# Patient Record
Sex: Female | Born: 1975 | Race: Black or African American | Hispanic: No | Marital: Single | Smoking: Current every day smoker
Health system: Southern US, Community
[De-identification: ages and names within clinical notes are randomized; demographics above are authoritative.]

## PROBLEM LIST (undated history)

## (undated) DIAGNOSIS — Z789 Other specified health status: Secondary | ICD-10-CM

## (undated) HISTORY — PX: NO PAST SURGERIES: SHX2092

---

## 2014-05-27 ENCOUNTER — Emergency Department (HOSPITAL_BASED_OUTPATIENT_CLINIC_OR_DEPARTMENT_OTHER)
Admission: EM | Admit: 2014-05-27 | Discharge: 2014-05-28 | Disposition: A | Discharge status: Home / Self Care | Payer: Medicaid Other | Attending: Emergency Medicine | Admitting: Emergency Medicine

## 2014-05-27 ENCOUNTER — Encounter (HOSPITAL_BASED_OUTPATIENT_CLINIC_OR_DEPARTMENT_OTHER): Payer: Self-pay | Admitting: *Deleted

## 2014-05-27 ENCOUNTER — Emergency Department (HOSPITAL_BASED_OUTPATIENT_CLINIC_OR_DEPARTMENT_OTHER): Payer: Medicaid Other

## 2014-05-27 DIAGNOSIS — Z3A12 12 weeks gestation of pregnancy: Secondary | ICD-10-CM | POA: Diagnosis not present

## 2014-05-27 DIAGNOSIS — Z79899 Other long term (current) drug therapy: Secondary | ICD-10-CM | POA: Diagnosis not present

## 2014-05-27 DIAGNOSIS — O2331 Infections of other parts of urinary tract in pregnancy, first trimester: Secondary | ICD-10-CM | POA: Diagnosis not present

## 2014-05-27 DIAGNOSIS — R1030 Lower abdominal pain, unspecified: Secondary | ICD-10-CM | POA: Diagnosis not present

## 2014-05-27 DIAGNOSIS — A5901 Trichomonal vulvovaginitis: Secondary | ICD-10-CM | POA: Insufficient documentation

## 2014-05-27 DIAGNOSIS — N390 Urinary tract infection, site not specified: Secondary | ICD-10-CM

## 2014-05-27 DIAGNOSIS — Z87891 Personal history of nicotine dependence: Secondary | ICD-10-CM | POA: Insufficient documentation

## 2014-05-27 DIAGNOSIS — O98311 Other infections with a predominantly sexual mode of transmission complicating pregnancy, first trimester: Secondary | ICD-10-CM | POA: Diagnosis not present

## 2014-05-27 DIAGNOSIS — O9989 Other specified diseases and conditions complicating pregnancy, childbirth and the puerperium: Secondary | ICD-10-CM | POA: Insufficient documentation

## 2014-05-27 DIAGNOSIS — Z3401 Encounter for supervision of normal first pregnancy, first trimester: Secondary | ICD-10-CM

## 2014-05-27 LAB — URINALYSIS, ROUTINE W REFLEX MICROSCOPIC
BILIRUBIN URINE: NEGATIVE
Glucose, UA: NEGATIVE mg/dL
Hgb urine dipstick: NEGATIVE
Ketones, ur: NEGATIVE mg/dL
Nitrite: NEGATIVE
PROTEIN: NEGATIVE mg/dL
Specific Gravity, Urine: 1.014 (ref 1.005–1.030)
Urobilinogen, UA: 0.2 mg/dL (ref 0.0–1.0)
pH: 5.5 (ref 5.0–8.0)

## 2014-05-27 LAB — URINE MICROSCOPIC-ADD ON

## 2014-05-27 LAB — WET PREP, GENITAL: YEAST WET PREP: NONE SEEN

## 2014-05-27 MED ORDER — CEPHALEXIN 250 MG PO CAPS
500.0000 mg | ORAL_CAPSULE | Freq: Once | ORAL | Status: AC
  Administered 2014-05-28: 500 mg via ORAL
  Filled 2014-05-27: qty 2

## 2014-05-27 MED ORDER — METRONIDAZOLE 500 MG PO TABS
500.0000 mg | ORAL_TABLET | Freq: Once | ORAL | Status: AC
  Administered 2014-05-28: 500 mg via ORAL
  Filled 2014-05-27: qty 1

## 2014-05-27 MED ORDER — ACETAMINOPHEN 500 MG PO TABS
1000.0000 mg | ORAL_TABLET | Freq: Once | ORAL | Status: AC
  Administered 2014-05-27: 1000 mg via ORAL
  Filled 2014-05-27: qty 2

## 2014-05-27 MED ORDER — LIDOCAINE HCL (PF) 1 % IJ SOLN
INTRAMUSCULAR | Status: AC
Start: 2014-05-27 — End: 2014-05-28
  Administered 2014-05-28: 1.5 mL
  Filled 2014-05-27: qty 5

## 2014-05-27 MED ORDER — METRONIDAZOLE 500 MG PO TABS: 500.0000 mg | ORAL_TABLET | Freq: Two times a day (BID) | ORAL | Status: DC

## 2014-05-27 MED ORDER — CEFTRIAXONE SODIUM 1 G IJ SOLR
1.0000 g | Freq: Once | INTRAMUSCULAR | Status: AC
  Administered 2014-05-28: 1 g via INTRAMUSCULAR
  Filled 2014-05-27: qty 10

## 2014-05-27 NOTE — ED Notes (Signed)
To pt room to assess pt not in room at this time.

## 2014-05-27 NOTE — ED Provider Notes (Signed)
CSN: [id number]     Arrival date & time 05/27/14  1821 History   First MD Initiated Contact with Patient 05/27/14 2125     Chief Complaint  Patient presents with  . Abdominal Pain     (Consider location/radiation/quality/duration/timing/severity/associated sxs/prior Treatment) HPI  Michelle Meza is a 39 y.o. female believe she is [redacted] weeks pregnant, last menstrual period on November 5, [facility], she is G3 P2 complaining of lower abdominal pain worsening over the course of 2 days, rated as severe, no pain medication taken prior to arrival. Patient denies dysuria, hematuria, abnormal vaginal discharge, vaginal bleeding, fever, chills, nausea, vomiting. She states that her pain is midline and she endorses a urinary frequency on review of systems. Patient has not seen OB/GYN in this pregnancy (note that this contradicts triage note) she's been taking prenatal vitamins.  History reviewed. No pertinent past medical history. History reviewed. No pertinent past surgical history. No family history on file. History  Substance Use Topics  . Smoking status: Former Games developermoker  . Smokeless tobacco: Not on file  . Alcohol Use: No   OB History    Gravida Para Term Preterm AB TAB SAB Ectopic Multiple Living   1              Review of Systems  10 systems reviewed and found to be negative, except as noted in the HPI.   Allergies  Review of patient's allergies indicates no known allergies.  Home Medications   Prior to Admission medications   Medication Sig Start Date End Date Taking? Authorizing Provider  Prenatal Vit-Fe Fumarate-FA (PRENATAL MULTIVITAMIN) TABS tablet Take 1 tablet by mouth daily at 12 noon.   Yes Historical Provider, MD  metroNIDAZOLE (FLAGYL) 500 MG tablet Take 1 tablet (500 mg total) by mouth 2 (two) times daily. One tab PO bid x 14 days 05/27/14   Joni ReiningNicole Durand Wittmeyer, PA-C   BP 99/47 mmHg  Pulse 61  Temp(Src) 98.1 F  (36.7 C) (Oral)  Resp 16  Ht 5' (1.524 m)  Wt 146 lb (66.225 kg)  BMI 28.51 kg/m2  SpO2 100%  LMP 03/22/2014 Physical Exam  Constitutional: She is oriented to person, place, and time. She appears well-developed and well-nourished. No distress.  HENT:  Head: Normocephalic and atraumatic.  Mouth/Throat: Oropharynx is clear and moist.  Eyes: Conjunctivae and EOM are normal. Pupils are equal, round, and reactive to light.  Neck: Normal range of motion.  Cardiovascular: Normal rate, regular rhythm and intact distal pulses.   Pulmonary/Chest: Effort normal and breath sounds normal. No stridor. No respiratory distress. She has no wheezes. She has no rales. She exhibits no tenderness.  Abdominal: Soft. Bowel sounds are normal. She exhibits no distension and no mass. There is no tenderness. There is no rebound and no guarding.  Genitourinary:  No CVA tenderness to palpation bilaterally  Pelvic exam is chaperoned by nurse: No rashes or lesions, there is a thin, opaque, non-foul-smelling white discharge from the cervix. No cervical motion or adnexal tenderness.  Musculoskeletal: Normal range of motion. She exhibits no edema or tenderness.  Neurological: She is alert and oriented to person, place, and time.  Psychiatric: She has a normal mood and affect.  Nursing note and vitals reviewed.   ED Course  Procedures (including critical care time) Labs Review Labs Reviewed  WET PREP, GENITAL - Abnormal; Notable for the following:    Trich, Wet Prep RARE (*)  Clue Cells Wet Prep HPF POC FEW (*)    WBC, Wet Prep HPF POC OCCASIONAL (*)    All other components within normal limits  URINALYSIS, ROUTINE W REFLEX MICROSCOPIC - Abnormal; Notable for the following:    APPearance CLOUDY (*)    Leukocytes, UA LARGE (*)    All other components within normal limits  URINE MICROSCOPIC-ADD ON - Abnormal; Notable for the following:    Squamous Epithelial / LPF FEW (*)    Bacteria, UA MANY (*)    All  other components within normal limits  URINE CULTURE  GC/CHLAMYDIA PROBE AMP    Imaging Review US Ob Comp Less 14 Wks  05/27/2014   CLINICAL DATA:  Acute onset of midline pelvic pain for 1 day. Chills. Initial encounter.  EXAM: OBSTETRIC <14 WK [place] AND TRANSVAGINAL OB US  TECHNIQUE: Both transabdominal and transvaginal ultrasound examinations were performed for complete evaluation of the gestation as well as the maternal uterus, adnexal regions, and pelvic cul-de-sac. Transvaginal technique was performed to assess early pregnancy.  COMPARISON:  Pelvic ultrasound performed 03/27/2003  FINDINGS: Intrauterine gestational sac: Visualized/normal in shape.  Yolk sac:  Yes  Embryo:  Yes  Cardiac Activity: Yes  Heart Rate: 160 bpm  CRL:   3.1 cm   10 w 0 d                  [place] EDC: 12/23/2014  Maternal uterus/adnexae: No subchorionic hemorrhage is noted. The uterus is otherwise unremarkable in appearance  The ovaries are within normal limits. The right ovary measures 3.3 x 1.4 x 2.6 cm, while the left ovary measures 3.9 x 2.4 x 2.7 cm with a mildly complex left-sided follicle. No suspicious adnexal masses are seen; there is no evidence for ovarian torsion.  A small amount of free fluid is noted in the pelvic cul-de-sac and left adnexa.  IMPRESSION: Single live intrauterine pregnancy noted, with a crown-rump length of 3.1 cm, corresponding to a gestational age of [redacted] weeks 0 days. This matches the gestational age of [redacted] weeks 3 days by LMP, reflecting an estimated date of delivery of December 27, 2014.   Electronically Signed   By: Roanna Raider M.D.   On: 05/27/2014 22:15   US Ob Transvaginal  05/27/2014   CLINICAL DATA:  Acute onset of midline pelvic pain for 1 day. Chills. Initial encounter.  EXAM: OBSTETRIC <14 WK [place] AND TRANSVAGINAL OB US  TECHNIQUE: Both transabdominal and transvaginal ultrasound examinations were performed for complete evaluation of the gestation as well as the maternal uterus, adnexal regions,  and pelvic cul-de-sac. Transvaginal technique was performed to assess early pregnancy.  COMPARISON:  Pelvic ultrasound performed 03/27/2003  FINDINGS: Intrauterine gestational sac: Visualized/normal in shape.  Yolk sac:  Yes  Embryo:  Yes  Cardiac Activity: Yes  Heart Rate: 160 bpm  CRL:   3.1 cm   10 w 0 d                  [place] EDC: 12/23/2014  Maternal uterus/adnexae: No subchorionic hemorrhage is noted. The uterus is otherwise unremarkable in appearance  The ovaries are within normal limits. The right ovary measures 3.3 x 1.4 x 2.6 cm, while the left ovary measures 3.9 x 2.4 x 2.7 cm with a mildly complex left-sided follicle. No suspicious adnexal masses are seen; there is no evidence for ovarian torsion.  A small amount of free fluid is noted in the pelvic cul-de-sac and left adnexa.  IMPRESSION: Single  live intrauterine pregnancy noted, with a crown-rump length of 3.1 cm, corresponding to a gestational age of [redacted] weeks 0 days. This matches the gestational age of [redacted] weeks 3 days by LMP, reflecting an estimated date of delivery of December 27, 2014.   Electronically Signed   By: Roanna Raider M.D.   On: 05/27/2014 22:15     EKG Interpretation None      MDM   Final diagnoses:  Lower abdominal pain  Pregnancy, first, first trimester  UTI (lower urinary tract infection)  Trichomonal vaginitis    Filed Vitals:   05/27/14 1858 05/27/14 2250  BP: 115/64 99/47  Pulse: 58 61  Temp: 99 F (37.2 C) 98.1 F (36.7 C)  TempSrc: Oral Oral  Resp: 18 16  Height: 5' (1.524 m)   Weight: 146 lb (66.225 kg)   SpO2: 100% 100%    Medications  metroNIDAZOLE (FLAGYL) tablet 500 mg (not administered)  cephALEXin (KEFLEX) capsule 500 mg (not administered)  cefTRIAXone (ROCEPHIN) injection 1 g (not administered)  acetaminophen (TYLENOL) tablet 1,000 mg (1,000 mg Oral Given 05/27/14 2239)    Michelle Meza is a pleasant 39 y.o. female in her first trimester presenting with lower midline abdominal pain and  urinary frequency. Ultrasound shows IUP at 10 weeks. Both ovaries are visualized and normal. No evidence for torsion. Urinalysis with many bacteria and 21-50 white blood cells, large leukocytes. Will treat her for urinary tract infection with Rocephin IM and Keflex by mouth and order urine culture.  Pelvic exam with no significant abnormalities, wet prep showing rare trek in a few clue cells. Will start her on Flagyl.  Patient states she has applied for emergency Medicaid and states that she was told that she should have her card this week.  Discussed case with attending MD who agrees with plan and stability to d/c to home.    Evaluation does not show pathology that would require ongoing emergent intervention or inpatient treatment. Pt is hemodynamically stable and mentating appropriately. Discussed findings and plan with patient/guardian, who agrees with care plan. All questions answered. Return precautions discussed and outpatient follow up given.   New Prescriptions   METRONIDAZOLE (FLAGYL) 500 MG TABLET    Take 1 tablet (500 mg total) by mouth 2 (two) times daily. One tab PO bid x 14 days         Wynetta Emery, PA-C 05/27/14 2310  Vida Roller, MD 05/27/14 [phone]

## 2014-05-27 NOTE — Discharge Instructions (Signed)
Please follow with your primary care doctor in the next 2 days for a check-up. They must obtain records for further management.   Do not hesitate to return to the Emergency Department for any new, worsening or concerning symptoms.    Abdominal Pain During Pregnancy Abdominal pain is common in pregnancy. Most of the time, it does not cause harm. There are many causes of abdominal pain. Some causes are more serious than others. Some of the causes of abdominal pain in pregnancy are easily diagnosed. Occasionally, the diagnosis takes time to understand. Other times, the cause is not determined. Abdominal pain can be a sign that something is very wrong with the pregnancy, or the pain may have nothing to do with the pregnancy at all. For this reason, always tell your health care provider if you have any abdominal discomfort. HOME CARE INSTRUCTIONS  Monitor your abdominal pain for any changes. The following actions may help to alleviate any discomfort you are experiencing:  Do not have sexual intercourse or put anything in your vagina until your symptoms go away completely.  Get plenty of rest until your pain improves.  Drink clear fluids if you feel nauseous. Avoid solid food as long as you are uncomfortable or nauseous.  Only take over-the-counter or prescription medicine as directed by your health care provider.  Keep all follow-up appointments with your health care provider. SEEK IMMEDIATE MEDICAL CARE IF:  You are bleeding, leaking fluid, or passing tissue from the vagina.  You have increasing pain or cramping.  You have persistent vomiting.  You have painful or bloody urination.  You have a fever.  You notice a decrease in your baby's movements.  You have extreme weakness or feel faint.  You have shortness of breath, with or without abdominal pain.  You develop a severe headache with abdominal pain.  You have abnormal vaginal discharge with abdominal pain.  You have persistent  diarrhea.  You have abdominal pain that continues even after rest, or gets worse. MAKE SURE YOU:   Understand these instructions.  Will watch your condition.  Will get help right away if you are not doing well or get worse. Document Released: 05/04/2005 Document Revised: 02/22/2013 Document Reviewed: 12/01/2012 [facility] Patient Information 2015 [employer] [place]. This information is not intended to replace advice given to you by your health care provider. Make sure you discuss any questions you have with your health care provider.

## 2014-05-27 NOTE — ED Notes (Addendum)
Pt states she is [redacted] weeks pregnant and is having sharp pain in her lower abd, denies N/V/D, and denies bleeding. Has an established ob/gyn

## 2014-05-28 LAB — GC/CHLAMYDIA PROBE AMP
CT PROBE, AMP APTIMA: NEGATIVE
GC Probe RNA: NEGATIVE

## 2014-05-29 LAB — URINE CULTURE: Colony Count: 60000

## 2014-05-30 NOTE — Progress Notes (Signed)
ED Antimicrobial Stewardship Positive Culture Follow Up   [name] is an 39 y.o. female who [facility] with a chief complaint of  Chief Complaint  Patient presents with  . Abdominal Pain    Recent Results (from the past 720 hour(s))  Urine culture     Status: None   Collection Time: 05/27/14 10:05 PM  Result Value Ref Range Status   Specimen Description URINE, CLEAN CATCH  Final   Special Requests NONE  Final   Colony Count   Final    60,000 COLONIES/ML Performed at [facility]    Culture   Final    DIPHTHEROIDS(CORYNEBACTERIUM SPECIES) Note: Standardized susceptibility testing for this organism is not available. Performed at [facility]    Report Status 05/29/2014 FINAL  Final  GC/Chlamydia Probe Amp (multiple spec sources)     Status: None   Collection Time: 05/27/14 10:30 PM  Result Value Ref Range Status   CT Probe RNA NEGATIVE NEGATIVE Final   GC Probe RNA NEGATIVE NEGATIVE Final    Comment: (NOTE)                                                                                       **Normal Reference Range: Negative**      Assay performed using the Gen-Probe APTIMA COMBO2 (R) Assay. Acceptable specimen types for this assay include APTIMA Swabs (Unisex, endocervical, urethral, or vaginal), first void urine, and ThinPrep liquid based cytology samples. Performed at [facility]   Wet prep, genital     Status: Abnormal   Collection Time: 05/27/14 10:30 PM  Result Value Ref Range Status   Yeast Wet Prep HPF POC NONE SEEN NONE SEEN Final   Trich, Wet Prep RARE (A) NONE SEEN Final   Clue Cells Wet Prep HPF POC FEW (A) NONE SEEN Final   WBC, Wet Prep HPF POC OCCASIONAL (A) NONE SEEN Final    [x]  Treated with metronidazole 500mg  po bid for 14 days for +Trich..  In addition, pt had urine culture 60,000 CFU/ml of Diphtheroids(corynebacterium). UA was cloudy, nitrite negative, leukocytes large, few squamous cells and  many bacteria. Pt is [redacted] weeks pregnant and had discussion with PA and was agreeable that no further treatment indicated at this time.   ED Provider: Jodelle RedMercedes Camprudi-Soms   Anapaula Severt L 05/30/2014, 2:11 PM Infectious Diseases Pharmacist Phone# [phone]

## 2014-08-23 ENCOUNTER — Other Ambulatory Visit (HOSPITAL_COMMUNITY): Payer: Self-pay | Admitting: Obstetrics and Gynecology

## 2014-08-23 DIAGNOSIS — O09522 Supervision of elderly multigravida, second trimester: Secondary | ICD-10-CM

## 2014-08-23 DIAGNOSIS — O289 Unspecified abnormal findings on antenatal screening of mother: Secondary | ICD-10-CM

## 2014-08-28 ENCOUNTER — Ambulatory Visit (HOSPITAL_COMMUNITY)
Admission: RE | Admit: 2014-08-28 | Discharge: 2014-08-28 | Disposition: A | Discharge status: Home / Self Care | Payer: Medicaid Other | Source: Ambulatory Visit | Attending: Obstetrics and Gynecology | Admitting: Obstetrics and Gynecology

## 2014-08-28 ENCOUNTER — Encounter (HOSPITAL_COMMUNITY): Payer: Self-pay

## 2014-08-28 ENCOUNTER — Other Ambulatory Visit (HOSPITAL_COMMUNITY): Payer: Self-pay | Admitting: Obstetrics and Gynecology

## 2014-08-28 DIAGNOSIS — Z315 Encounter for genetic counseling: Secondary | ICD-10-CM | POA: Insufficient documentation

## 2014-08-28 DIAGNOSIS — Z3A23 23 weeks gestation of pregnancy: Secondary | ICD-10-CM | POA: Insufficient documentation

## 2014-08-28 DIAGNOSIS — O09529 Supervision of elderly multigravida, unspecified trimester: Secondary | ICD-10-CM | POA: Insufficient documentation

## 2014-08-28 DIAGNOSIS — O289 Unspecified abnormal findings on antenatal screening of mother: Secondary | ICD-10-CM | POA: Insufficient documentation

## 2014-08-28 DIAGNOSIS — O351XX0 Maternal care for (suspected) chromosomal abnormality in fetus, not applicable or unspecified: Secondary | ICD-10-CM | POA: Insufficient documentation

## 2014-08-28 DIAGNOSIS — Z3A22 22 weeks gestation of pregnancy: Secondary | ICD-10-CM | POA: Insufficient documentation

## 2014-08-28 DIAGNOSIS — O09522 Supervision of elderly multigravida, second trimester: Secondary | ICD-10-CM | POA: Insufficient documentation

## 2014-08-28 DIAGNOSIS — Z3689 Encounter for other specified antenatal screening: Secondary | ICD-10-CM | POA: Insufficient documentation

## 2014-08-28 HISTORY — DX: Other specified health status: Z78.9

## 2014-08-28 NOTE — Progress Notes (Signed)
Genetic Counseling  High-Risk Gestation Note  Appointment Date:  08/28/2014 Referred By: Michelle HammingMcNamara, Michael T, MD Date of Birth:  01/02/1976 Partner:  Michelle Meza   Pregnancy History: [id number] Estimated Date of Delivery: 12/27/14 Estimated Gestational Age: 4852w5d Attending: Particia Nearingecker, Martha, MD   [name] was seen for genetic counseling regarding a maternal age of 39 and an increased risk for Down syndrome based on maternal serum Quad screening.  She was counseled regarding maternal age and the association with risk for chromosome conditions due to nondisjunction with aging of the ova.   We reviewed chromosomes, nondisjunction, and the associated 1 in 100 risk for fetal aneuploidy related to a maternal age of 39 at 6152w5d gestation. She was counseled that the risk for aneuploidy decreases as gestational age increases, accounting for those pregnancies which spontaneously abort.  We specifically discussed Down syndrome (trisomy 9321), trisomies 1513 and 6318, and sex chromosome aneuploidies (47,XXX and 47,XXY) including the common features and prognoses of each.   We also reviewed [name]' maternal serum Quad screen result and the associated increase in risk for fetal Down syndrome (1 in 260 to 1 in 206).  She was counseled regarding other explanations for a screen positive result including normal variation, a gestational dating error, and differences in maternal metabolism.  In addition, we reviewed the screen adjusted reduction in risks for trisomy 18 (1 in 782 to 1 in 10,000) and ONTDs.  She understands that Quad screening provides a pregnancy specific risk for Down syndrome, but is not considered to be diagnostic. Of note, [name] Michelle Meza of 12/23/14 is based on first trimester ultrasound because [name] is uncertain of her LMP.  Based on the LMP provided, her EDC is 12/27/14.  This is the due date that was used to calculate the maternal serum screen.  The results were not recalculated today, given that  the dating discrepancy was only 3 days.    We reviewed available screening options including noninvasive prenatal screening (NIPS)/cell free DNA (cfDNA) testing and detailed ultrasound.  She was counseled that screening tests are used to modify a patient's a priori risk for aneuploidy, typically based on age. This estimate provides a pregnancy specific risk assessment. We reviewed the benefits and limitations of each option. Specifically, we discussed the conditions for which each test screens, the detection rates, and false positive rates of each. She was also counseled regarding diagnostic testing via amniocentesis. We reviewed the approximate 1 in 300-500 risk for complications for amniocentesis, including spontaneous pregnancy loss. After consideration of all the options, she elected to proceed with NIPS.  Those results will be available in 8-10 days. The patient also expressed interest in having a detailed ultrasound.  A complete ultrasound was performed today. The ultrasound report will be documented separately. There were no visualized fetal anomalies or markers suggestive of aneuploidy. Amniocentesis was declined today.  She understands that screening tests cannot rule out all birth defects or genetic syndromes. The patient was advised of this limitation and states she still does not want additional testing at this time.   [name] reported that the FOB, Mr. Michelle Meza died in December 2015 from a heart attack.  He was 45 when he died.  [name] was counseled that advanced paternal age (APA) is defined as paternal age greater than or equal to age 37.  Recent large-scale sequencing studies have shown that approximately 80% of de novo point mutations are of paternal origin.  Many studies have demonstrated a strong correlation  between increased paternal age and de novo point mutations.  Although no specific data is available regarding fetal risks for fathers 52+ years old at conception, it is apparent that the  overall risk for single gene conditions is increased.  To estimate the relative increase in risk of a genetic disorder with APA, the heritability of the disease must be considered.  Assuming an approximate 2x increase in risk for conditions that are exclusively paternal in origin, the risk for each individual condition is still relatively low.  It is estimated that the overall chance for a de novo mutation is ~0.5%.  We also discussed the wide range of conditions which can be caused by new dominant gene mutations (achondroplasia, neurofibromatosis, Marfan syndrome etc.).  She was counseled that genetic testing for each individual single gene condition is not warranted or available unless ultrasound or family history concerns lend suspicion to a specific condition.  We discussed the recommendation for a detailed ultrasound at 18+ weeks gestation and a follow up ultrasound at ~28 weeks to monitor fetal growth.  Lastly, we discussed that newer literature suggests that the risk for autism spectrum disorders (ASD) may be increased in children born to fathers of APA.  We discussed that ASDs are among the most common neurodevelopmental disorders, with approximately 1 in 85 children meeting criteria for ASD.  Approximately 80% of individuals diagnosed are female.  There is strong evidence that genetic factors play a critical role in development of ASD.  While there have been recent advances in identifying specific genetic causes of ASD, there are still many individuals for whom the etiology of the ASD is not known.  She understands that at this time there is no reliable, comprehensive genetic testing available for ASD.     Michelle Meza was provided with written information regarding sickle cell anemia (SCA) including the carrier frequency and incidence in the African-American population, the availability of carrier testing and prenatal diagnosis if indicated.  In addition, we discussed that hemoglobinopathies are routinely  screened for as part of the [place] newborn screening panel.  Ms. Bega had hemoglobin electrophoresis performed previously.  We reviewed the normal results.    Both family histories were reviewed and found to be noncontributory for birth defects, intellectual disability, and known genetic conditions. Without further information regarding the provided family history, an accurate genetic risk cannot be calculated. Further genetic counseling is warranted if more information is obtained.  Ms. Josey denied exposure to environmental toxins or chemical agents. She denied the use of alcohol, tobacco or street drugs. She denied significant viral illnesses during the course of her pregnancy.   I counseled Ms. Cranmer regarding the above risks and available options.  The approximate face-to-face time with the genetic counselor was 36 minutes.    Donald Prose, MS  Certified Genetic Counselor

## 2014-08-29 ENCOUNTER — Ambulatory Visit (HOSPITAL_COMMUNITY): Payer: Medicaid Other

## 2014-09-05 ENCOUNTER — Other Ambulatory Visit (HOSPITAL_COMMUNITY): Payer: Self-pay | Admitting: Obstetrics and Gynecology

## 2014-09-05 ENCOUNTER — Encounter (HOSPITAL_COMMUNITY): Payer: Self-pay | Admitting: Obstetrics and Gynecology

## 2014-09-05 DIAGNOSIS — Z3A27 27 weeks gestation of pregnancy: Secondary | ICD-10-CM

## 2014-09-05 DIAGNOSIS — O26843 Uterine size-date discrepancy, third trimester: Secondary | ICD-10-CM

## 2014-09-06 ENCOUNTER — Telehealth (HOSPITAL_COMMUNITY): Payer: Self-pay

## 2014-09-06 NOTE — Telephone Encounter (Signed)
Called Michelle Meza to discuss her cell free fetal DNA test results.  [name] had [facility] testing through [facility].  Testing was offered because of a maternal age of 39 and an abnl Quad screen. The patient was identified by name and DOB.  We reviewed that these are within normal limits, showing a less than 1 in 10,000 risk for trisomies 21, 18 and 13, and monosomy X (Turner syndrome).  In addition, the risk for triploidy/vanishing twin and sex chromosome trisomies (47,XXX and 47,XXY) was also low risk.  We reviewed that this testing identifies > 99% of pregnancies with trisomy 6021, trisomy 5113, sex chromosome trisomies (47,XXX and 47,XXY), and triploidy. The detection rate for trisomy 18 is 96%.  The detection rate for monosomy X is ~92%.  The false positive rate is <0.1% for all conditions. She understands that this testing does not identify all genetic conditions.  All questions were answered to her satisfaction, she was encouraged to call with additional questions or concerns.  Despina AriasSTANLEY, Avrohom Mckelvin, MS Certified Genetic Counselor

## 2014-09-10 ENCOUNTER — Other Ambulatory Visit (HOSPITAL_COMMUNITY): Payer: Self-pay | Admitting: Obstetrics and Gynecology

## 2014-09-25 ENCOUNTER — Encounter (HOSPITAL_COMMUNITY): Payer: Self-pay

## 2014-09-25 ENCOUNTER — Ambulatory Visit (HOSPITAL_COMMUNITY)
Admission: RE | Admit: 2014-09-25 | Discharge: 2014-09-25 | Disposition: A | Discharge status: Home / Self Care | Payer: Medicaid Other | Source: Ambulatory Visit | Attending: Obstetrics and Gynecology | Admitting: Obstetrics and Gynecology

## 2014-09-25 ENCOUNTER — Other Ambulatory Visit (HOSPITAL_COMMUNITY): Payer: Self-pay | Admitting: Obstetrics and Gynecology

## 2014-09-25 DIAGNOSIS — Z3A27 27 weeks gestation of pregnancy: Secondary | ICD-10-CM | POA: Insufficient documentation

## 2014-09-25 DIAGNOSIS — O26843 Uterine size-date discrepancy, third trimester: Secondary | ICD-10-CM

## 2014-09-25 DIAGNOSIS — O09522 Supervision of elderly multigravida, second trimester: Secondary | ICD-10-CM | POA: Diagnosis not present

## 2014-09-25 DIAGNOSIS — O283 Abnormal ultrasonic finding on antenatal screening of mother: Secondary | ICD-10-CM | POA: Diagnosis present

## 2014-10-23 ENCOUNTER — Ambulatory Visit (HOSPITAL_COMMUNITY)
Admission: RE | Admit: 2014-10-23 | Discharge: 2014-10-23 | Disposition: A | Discharge status: Home / Self Care | Payer: Medicaid Other | Source: Ambulatory Visit | Attending: Obstetrics and Gynecology | Admitting: Obstetrics and Gynecology

## 2014-10-23 ENCOUNTER — Other Ambulatory Visit (HOSPITAL_COMMUNITY): Payer: Self-pay | Admitting: Obstetrics and Gynecology

## 2014-10-23 ENCOUNTER — Other Ambulatory Visit (HOSPITAL_COMMUNITY): Payer: Self-pay | Admitting: Maternal and Fetal Medicine

## 2014-10-23 VITALS — BP 127/77 | HR 80 | Wt 170.8 lb

## 2014-10-23 DIAGNOSIS — O403XX0 Polyhydramnios, third trimester, not applicable or unspecified: Secondary | ICD-10-CM | POA: Diagnosis not present

## 2014-10-23 DIAGNOSIS — Z3A31 31 weeks gestation of pregnancy: Secondary | ICD-10-CM | POA: Insufficient documentation

## 2014-10-23 DIAGNOSIS — O09523 Supervision of elderly multigravida, third trimester: Secondary | ICD-10-CM | POA: Diagnosis not present

## 2014-10-23 DIAGNOSIS — O283 Abnormal ultrasonic finding on antenatal screening of mother: Secondary | ICD-10-CM | POA: Diagnosis present

## 2014-10-23 DIAGNOSIS — O26843 Uterine size-date discrepancy, third trimester: Secondary | ICD-10-CM

## 2014-10-24 DIAGNOSIS — O409XX0 Polyhydramnios, unspecified trimester, not applicable or unspecified: Secondary | ICD-10-CM | POA: Insufficient documentation

## 2014-10-24 DIAGNOSIS — Z3A31 31 weeks gestation of pregnancy: Secondary | ICD-10-CM | POA: Insufficient documentation

## 2014-10-24 DIAGNOSIS — O09529 Supervision of elderly multigravida, unspecified trimester: Secondary | ICD-10-CM | POA: Insufficient documentation

## 2014-11-21 ENCOUNTER — Ambulatory Visit (HOSPITAL_COMMUNITY): Admission: RE | Admit: 2014-11-21 | Payer: Medicaid Other | Source: Ambulatory Visit

## 2014-11-27 ENCOUNTER — Ambulatory Visit (HOSPITAL_COMMUNITY)
Admission: RE | Admit: 2014-11-27 | Discharge: 2014-11-27 | Disposition: A | Discharge status: Home / Self Care | Payer: Medicaid Other | Source: Ambulatory Visit | Attending: Maternal and Fetal Medicine | Admitting: Maternal and Fetal Medicine

## 2014-11-27 ENCOUNTER — Encounter (HOSPITAL_COMMUNITY): Payer: Self-pay

## 2014-11-27 DIAGNOSIS — O26843 Uterine size-date discrepancy, third trimester: Secondary | ICD-10-CM | POA: Insufficient documentation

## 2014-11-27 DIAGNOSIS — Z3A00 Weeks of gestation of pregnancy not specified: Secondary | ICD-10-CM | POA: Diagnosis not present

## 2014-11-27 DIAGNOSIS — Z3A36 36 weeks gestation of pregnancy: Secondary | ICD-10-CM | POA: Insufficient documentation

## 2015-07-03 ENCOUNTER — Encounter (HOSPITAL_COMMUNITY): Payer: Self-pay | Admitting: *Deleted

## 2016-10-17 ENCOUNTER — Encounter (HOSPITAL_BASED_OUTPATIENT_CLINIC_OR_DEPARTMENT_OTHER): Payer: Self-pay | Admitting: Emergency Medicine

## 2016-10-17 ENCOUNTER — Emergency Department (HOSPITAL_BASED_OUTPATIENT_CLINIC_OR_DEPARTMENT_OTHER)
Admission: EM | Admit: 2016-10-17 | Discharge: 2016-10-17 | Disposition: A | Discharge status: Home / Self Care | Payer: Medicaid Other | Attending: Emergency Medicine | Admitting: Emergency Medicine

## 2016-10-17 DIAGNOSIS — L03311 Cellulitis of abdominal wall: Secondary | ICD-10-CM

## 2016-10-17 DIAGNOSIS — Z87891 Personal history of nicotine dependence: Secondary | ICD-10-CM | POA: Diagnosis not present

## 2016-10-17 DIAGNOSIS — Z79899 Other long term (current) drug therapy: Secondary | ICD-10-CM | POA: Diagnosis not present

## 2016-10-17 DIAGNOSIS — L02211 Cutaneous abscess of abdominal wall: Secondary | ICD-10-CM | POA: Insufficient documentation

## 2016-10-17 DIAGNOSIS — L0291 Cutaneous abscess, unspecified: Secondary | ICD-10-CM

## 2016-10-17 MED ORDER — LIDOCAINE HCL (PF) 1 % IJ SOLN
5.0000 mL | Freq: Once | INTRAMUSCULAR | Status: AC
  Administered 2016-10-17: 5 mL
  Filled 2016-10-17: qty 5

## 2016-10-17 MED ORDER — PENTAFLUOROPROP-TETRAFLUOROETH EX AERO
INHALATION_SPRAY | CUTANEOUS | Status: AC
  Filled 2016-10-17: qty 30

## 2016-10-17 MED ORDER — TRAMADOL HCL 50 MG PO TABS: 100.0000 mg | ORAL_TABLET | Freq: Four times a day (QID) | ORAL | 0 refills | Status: DC | PRN

## 2016-10-17 MED ORDER — CEPHALEXIN 500 MG PO CAPS: ORAL_CAPSULE | ORAL | 0 refills | Status: DC

## 2016-10-17 MED ORDER — IBUPROFEN 600 MG PO TABS: 600.0000 mg | ORAL_TABLET | Freq: Four times a day (QID) | ORAL | 0 refills | Status: DC | PRN

## 2016-10-17 MED ORDER — PENTAFLUOROPROP-TETRAFLUOROETH EX AERO
INHALATION_SPRAY | CUTANEOUS | Status: DC | PRN
  Administered 2016-10-17: 30 via TOPICAL

## 2016-10-17 MED ORDER — LIDOCAINE-EPINEPHRINE-TETRACAINE (LET) SOLUTION
3.0000 mL | Freq: Once | NASAL | Status: AC
  Administered 2016-10-17: 3 mL via TOPICAL
  Filled 2016-10-17: qty 3

## 2016-10-17 NOTE — ED Triage Notes (Signed)
Patient states that she was seen at [facility] 2 days ago for the same and told that she is having an allergic reaction to something. Patient states that she continues to have the redness and swelling to her left abdominal area

## 2016-10-17 NOTE — ED Provider Notes (Signed)
[facility] Provider Note   CSN: [id number] Arrival date & time: 10/17/16  1818  By signing my name below, I, Bing NeighborsMaurice Deon Copeland Jr., attest that this documentation has been prepared under the direction and in the presence of Arby BarrettePfeiffer, Eulalia Ellerman, MD. Electronically signed: Bing NeighborsMaurice Deon Copeland Jr., ED Scribe. 10/17/16. 9:28 PM.   History   Chief Complaint Chief Complaint  Patient presents with  . Abscess    HPI Michelle Meza is a 41 y.o. female who presents to the Emergency Department complaining of abscess with onset x2 days. Pt has red swollen tender area to L lower abdomen that onset x2 days ago. Pt was seen x1 day ago at [facility] and started on bactrim. Pt has taken x3 doses. Increased redness, pain and swelling since yesterday. Some chills and sweats but no documented fever. No hx of similar. No other medical hx. No insect bite or injury she is aware of.   The history is provided by the patient. No language interpreter was used.    Past Medical History:  Diagnosis Date  . Medical history non-contributory     Patient Active Problem List   Diagnosis Date Noted  . [redacted] weeks gestation of pregnancy   . [redacted] weeks gestation of pregnancy   . Maternal age 41+, multigravida, antepartum   . Polyhydramnios   . Multigravida of advanced maternal age in second trimester 08/28/2014  . [redacted] weeks gestation of pregnancy 08/28/2014  . [redacted] weeks gestation of pregnancy   . Abnormal findings on antenatal screening   . AMA (advanced maternal age) multigravida 35+   . Encounter for fetal anatomic survey     Past Surgical History:  Procedure Laterality Date  . NO PAST SURGERIES      OB History    Gravida Para Term Preterm AB Living   3 2 2     2    SAB TAB Ectopic Multiple Live Births                   Home Medications    Prior to Admission medications   Medication Sig Start Date End Date Taking? Authorizing Provider  sulfamethoxazole-trimethoprim (BACTRIM  DS,SEPTRA DS) 800-160 MG tablet Take 1 tablet by mouth 2 (two) times daily.   Yes [provider]  cephALEXin (KEFLEX) 500 MG capsule 2 caps po bid x 7 days 10/17/16   Arby BarrettePfeiffer, Duy Lemming, MD  ibuprofen (ADVIL,MOTRIN) 600 MG tablet Take 1 tablet (600 mg total) by mouth every 6 (six) hours as needed. 10/17/16   Arby BarrettePfeiffer, Ioana Louks, MD  metroNIDAZOLE (FLAGYL) 500 MG tablet Take 1 tablet (500 mg total) by mouth 2 (two) times daily. One tab PO bid x 14 days Patient not taking: Reported on 08/28/2014 05/27/14   Pisciotta, Joni ReiningNicole, PA-C  Prenatal Vit-Fe Fumarate-FA (PRENATAL MULTIVITAMIN) TABS tablet Take 1 tablet by mouth daily at 12 noon.    [provider]  traMADol (ULTRAM) 50 MG tablet Take 2 tablets (100 mg total) by mouth every 6 (six) hours as needed. 10/17/16   Arby BarrettePfeiffer, Lathen Seal, MD    Family History History reviewed. No pertinent family history.  Social History Social History  Substance Use Topics  . Smoking status: Former Games developermoker  . Smokeless tobacco: Never Used  . Alcohol use No     Allergies   Patient has no known allergies.   Review of Systems Review of Systems  All other systems reviewed and are negative for acute change except as noted in the HPI.  Physical Exam Updated Vital Signs BP 106/75 (BP Location: Right Arm)   Pulse 76   Temp 99.1 F (37.3 C) (Oral)   Resp 20   Ht 5' (1.524 m)   Wt 73.9 kg (163 lb)   LMP 09/27/2016   SpO2 99%   BMI 31.83 kg/m   Physical Exam  Constitutional: She is oriented to person, place, and time. She appears well-developed and well-nourished. No distress.  HENT:  Head: Normocephalic and atraumatic.  Eyes: Conjunctivae are normal.  Neck: Neck supple.  Cardiovascular:  No murmur heard. Pulmonary/Chest: Effort normal. No respiratory distress.  Abdominal: Soft. There is tenderness.  Patient has localized erythema of the lower left abdomen approximately 15 x 10 cm. Within this there is an indurated central region and a 1-2 mm  pustule. This there is very tender. Remainder abdomen is nontender to deep palpation. No intra-abdominal tenderness.  Musculoskeletal: She exhibits no edema.  Neurological: She is alert and oriented to person, place, and time. No cranial nerve deficit. She exhibits normal muscle tone. Coordination normal.  Skin: Skin is warm and dry.  Psychiatric: She has a normal mood and affect.  Nursing note and vitals reviewed.    ED Treatments / Results   DIAGNOSTIC STUDIES: Oxygen Saturation is 100% on RA, normal by my interpretation.   COORDINATION OF CARE: 9:28 PM-Discussed next steps with pt. Pt verbalized understanding and is agreeable with the plan.    Labs (all labs ordered are listed, but only abnormal results are displayed) Labs Reviewed - No data to display  EKG  EKG Interpretation None       Radiology No results found.  Procedures .Marland KitchenIncision and Drainage Date/Time: 10/17/2016 9:28 PM Performed by: Arby Barrette Authorized by: Arby Barrette   Consent:    Consent obtained:  Verbal   Consent given by:  Patient Location:    Type:  Abscess   Location:  Trunk   Trunk location:  Abdomen Pre-procedure details:    Skin preparation:  Betadine Anesthesia (see MAR for exact dosages):    Anesthesia method:  Topical application and local infiltration   Topical anesthetic:  LET   Local anesthetic:  Lidocaine 1% w/o epi Procedure type:    Complexity:  Complex Procedure details:    Incision types:  Single with marsupialization   Incision depth:  Subcutaneous   Scalpel blade:  11   Wound management:  Probed and deloculated   Drainage:  Purulent and bloody   Drainage amount:  Moderate   Packing materials:  1/4 in iodoform gauze   Amount 1/4" iodoform:  6 cm Post-procedure details:    Patient tolerance of procedure:  Tolerated well, no immediate complications   (including critical care time)  Medications Ordered in ED Medications  pentafluoroprop-tetrafluoroeth  (GEBAUERS) aerosol (30 application Topical Given by Other 10/17/16 2027)  lidocaine-EPINEPHrine-tetracaine (LET) solution (3 mLs Topical Given 10/17/16 2018)  lidocaine (PF) (XYLOCAINE) 1 % injection 5 mL (5 mLs Infiltration Given by Other 10/17/16 2018)     Initial Impression / Assessment and Plan / ED Course  I have reviewed the triage vital signs and the nursing notes.  Pertinent labs & imaging results that were available during my care of the patient were reviewed by me and considered in my medical decision making (see chart for details).      Final Clinical Impressions(s) / ED Diagnoses   Final diagnoses:  Abscess  Cellulitis of abdominal wall   Patient started Bactrim and has had 3 doses. She continued  to have increasing redness and pain. There was a central indurated area with a pustule on top. incision and drainage yielded moderate purulent drainage. Keflex was added and patient will continue the Bactrim as prescribed, ibuprofen and tramadol prescribed for pain. Return precautions reviewed. New Prescriptions New Prescriptions   CEPHALEXIN (KEFLEX) 500 MG CAPSULE    2 caps po bid x 7 days   IBUPROFEN (ADVIL,MOTRIN) 600 MG TABLET    Take 1 tablet (600 mg total) by mouth every 6 (six) hours as needed.   TRAMADOL (ULTRAM) 50 MG TABLET    Take 2 tablets (100 mg total) by mouth every 6 (six) hours as needed.       Arby Barrette, MD 10/17/16 2131

## 2019-01-11 ENCOUNTER — Emergency Department (HOSPITAL_BASED_OUTPATIENT_CLINIC_OR_DEPARTMENT_OTHER)
Admission: EM | Admit: 2019-01-11 | Discharge: 2019-01-11 | Disposition: A | Discharge status: Home / Self Care | Payer: Medicaid Other | Attending: Emergency Medicine | Admitting: Emergency Medicine

## 2019-01-11 ENCOUNTER — Encounter (HOSPITAL_BASED_OUTPATIENT_CLINIC_OR_DEPARTMENT_OTHER): Payer: Self-pay

## 2019-01-11 ENCOUNTER — Other Ambulatory Visit: Payer: Self-pay

## 2019-01-11 DIAGNOSIS — F17210 Nicotine dependence, cigarettes, uncomplicated: Secondary | ICD-10-CM | POA: Diagnosis not present

## 2019-01-11 DIAGNOSIS — R110 Nausea: Secondary | ICD-10-CM | POA: Insufficient documentation

## 2019-01-11 DIAGNOSIS — K5900 Constipation, unspecified: Secondary | ICD-10-CM | POA: Insufficient documentation

## 2019-01-11 NOTE — ED Provider Notes (Signed)
[facility] EMERGENCY DEPARTMENT Provider Note   CSN: [id number] Arrival date & time: 01/11/19  2132     History   Chief Complaint Chief Complaint  Patient presents with  . Constipation    HPI Latoia Eyster is a 43 y.o. female.     43 yo F with a cc of constipation.  Going on for the past three days.  Nausea but no vomiting. Tried mag citrate without improvement.  No vaginal bleeding, discharge.  Pain in the rectum. No new meds.   The history is provided by the patient.  Constipation Severity:  Mild Time since last bowel movement:  3 days Timing:  Constant Progression:  Unchanged Chronicity:  New Stool description:  None produced Relieved by:  Nothing Worsened by:  Nothing Ineffective treatments:  None tried Associated symptoms: no dysuria, no fever, no nausea and no vomiting     Past Medical History:  Diagnosis Date  . Medical history non-contributory     Patient Active Problem List   Diagnosis Date Noted  . [redacted] weeks gestation of pregnancy   . [redacted] weeks gestation of pregnancy   . Maternal age 52+, multigravida, antepartum   . Polyhydramnios   . Multigravida of advanced maternal age in second trimester 08/28/2014  . [redacted] weeks gestation of pregnancy 08/28/2014  . [redacted] weeks gestation of pregnancy   . Abnormal findings on antenatal screening   . AMA (advanced maternal age) multigravida 99+   . Encounter for fetal anatomic survey     Past Surgical History:  Procedure Laterality Date  . NO PAST SURGERIES       OB History    Gravida  3   Para  2   Term  2   Preterm      AB      Living  2     SAB      TAB      Ectopic      Multiple      Live Births               Home Medications    Prior to Admission medications   Medication Sig Start Date End Date Taking? Authorizing Provider  cephALEXin (KEFLEX) 500 MG capsule 2 caps po bid x 7 days 10/17/16   Charlesetta Shanks, MD  ibuprofen (ADVIL,MOTRIN) 600 MG tablet Take 1 tablet (600  mg total) by mouth every 6 (six) hours as needed. 10/17/16   Charlesetta Shanks, MD  metroNIDAZOLE (FLAGYL) 500 MG tablet Take 1 tablet (500 mg total) by mouth 2 (two) times daily. One tab PO bid x 14 days Patient not taking: Reported on 08/28/2014 05/27/14   Pisciotta, Elmyra Ricks, PA-C  Prenatal Vit-Fe Fumarate-FA (PRENATAL MULTIVITAMIN) TABS tablet Take 1 tablet by mouth daily at 12 noon.    [provider]  sulfamethoxazole-trimethoprim (BACTRIM DS,SEPTRA DS) 800-160 MG tablet Take 1 tablet by mouth 2 (two) times daily.    [provider]  traMADol (ULTRAM) 50 MG tablet Take 2 tablets (100 mg total) by mouth every 6 (six) hours as needed. 10/17/16   Charlesetta Shanks, MD    Family History No family history on file.  Social History Social History   Tobacco Use  . Smoking status: Current Every Day Smoker  . Smokeless tobacco: Never Used  Substance Use Topics  . Alcohol use: No  . Drug use: Not Currently    Types: Marijuana     Allergies   Patient has no known allergies.  Review of Systems Review of Systems  Constitutional: Negative for chills and fever.  HENT: Negative for congestion and rhinorrhea.   Eyes: Negative for redness and visual disturbance.  Respiratory: Negative for shortness of breath and wheezing.   Cardiovascular: Negative for chest pain and palpitations.  Gastrointestinal: Positive for constipation. Negative for nausea and vomiting.  Genitourinary: Negative for dysuria and urgency.  Musculoskeletal: Negative for arthralgias and myalgias.  Skin: Negative for pallor and wound.  Neurological: Negative for dizziness and headaches.     Physical Exam Updated Vital Signs BP (!) 109/56 (BP Location: Right Arm)   Pulse 85   Temp 99.6 F (37.6 C) (Oral)   Resp 18   Ht 5' (1.524 m)   Wt 73 kg   LMP 12/24/2018   SpO2 99%   BMI 31.44 kg/m   Physical Exam Vitals signs and nursing note reviewed.  Constitutional:      General: She is not in acute  distress.    Appearance: She is well-developed. She is not diaphoretic.  HENT:     Head: Normocephalic and atraumatic.  Eyes:     Pupils: Pupils are equal, round, and reactive to light.  Neck:     Musculoskeletal: Normal range of motion and neck supple.  Cardiovascular:     Rate and Rhythm: Normal rate and regular rhythm.     Heart sounds: No murmur. No friction rub. No gallop.   Pulmonary:     Effort: Pulmonary effort is normal.     Breath sounds: No wheezing or rales.  Abdominal:     General: There is no distension.     Palpations: Abdomen is soft.     Tenderness: There is no abdominal tenderness.  Musculoskeletal:        General: No tenderness.  Skin:    General: Skin is warm and dry.  Neurological:     Mental Status: She is alert and oriented to person, place, and time.  Psychiatric:        Behavior: Behavior normal.      ED Treatments / Results  Labs (all labs ordered are listed, but only abnormal results are displayed) Labs Reviewed - No data to display  EKG None  Radiology No results found.  Procedures Procedures (including critical care time)  Medications Ordered in ED Medications - No data to display   Initial Impression / Assessment and Plan / ED Course  I have reviewed the triage vital signs and the nursing notes.  Pertinent labs & imaging results that were available during my care of the patient were reviewed by me and considered in my medical decision making (see chart for details).        43 yo F with a chief complaints of constipation.  Going for 3 days.  Well-appearing nontoxic.  No vomiting no prior abdominal surgeries.  Benign abdominal exam.  Will do a MiraLAX trial at home.  PCP follow-up.  11:31 PM:  I have discussed the diagnosis/risks/treatment options with the patient and believe the pt to be eligible for discharge home to follow-up with PCP. We also discussed returning to the ED immediately if new or worsening sx occur. We discussed  the sx which are most concerning (e.g., sudden worsening pain, fever, inability to tolerate by mouth) that necessitate immediate return. Medications administered to the patient during their visit and any new prescriptions provided to the patient are listed below.  Medications given during this visit Medications - No data to display   The patient  appears reasonably screen and/or stabilized for discharge and I doubt any other medical condition or other [facility] requiring further screening, evaluation, or treatment in the ED at this time prior to discharge.    Final Clinical Impressions(s) / ED Diagnoses   Final diagnoses:  Constipation, unspecified constipation type    ED Discharge Orders    None       Melene PlanFloyd, Dianah Pruett, DO 01/11/19 2331

## 2019-01-11 NOTE — Discharge Instructions (Signed)
Take 8 scoops of miralax in 32oz of whatever you would like to drink.(Gatorade comes in this size) You can also use a fleets enema which you can buy over the counter at the pharmacy.  Return for worsening abdominal pain, vomiting or fever. ? ?

## 2019-01-11 NOTE — ED Triage Notes (Addendum)
Pt c/o constipation-last BM 3 days ago-pt states she took mag citrate w/o results-NAD-steady gait

## 2019-01-11 NOTE — ED Notes (Signed)
ED Provider at bedside. 

## 2019-03-28 ENCOUNTER — Emergency Department (HOSPITAL_BASED_OUTPATIENT_CLINIC_OR_DEPARTMENT_OTHER)
Admission: EM | Admit: 2019-03-28 | Discharge: 2019-03-29 | Disposition: A | Discharge status: Home / Self Care | Payer: Medicaid Other | Attending: Emergency Medicine | Admitting: Emergency Medicine

## 2019-03-28 ENCOUNTER — Emergency Department (HOSPITAL_BASED_OUTPATIENT_CLINIC_OR_DEPARTMENT_OTHER): Payer: Medicaid Other

## 2019-03-28 ENCOUNTER — Encounter (HOSPITAL_BASED_OUTPATIENT_CLINIC_OR_DEPARTMENT_OTHER): Payer: Self-pay

## 2019-03-28 ENCOUNTER — Other Ambulatory Visit: Payer: Self-pay

## 2019-03-28 DIAGNOSIS — R0989 Other specified symptoms and signs involving the circulatory and respiratory systems: Secondary | ICD-10-CM | POA: Insufficient documentation

## 2019-03-28 DIAGNOSIS — R0789 Other chest pain: Secondary | ICD-10-CM

## 2019-03-28 DIAGNOSIS — Z20828 Contact with and (suspected) exposure to other viral communicable diseases: Secondary | ICD-10-CM | POA: Insufficient documentation

## 2019-03-28 DIAGNOSIS — F17210 Nicotine dependence, cigarettes, uncomplicated: Secondary | ICD-10-CM | POA: Insufficient documentation

## 2019-03-28 DIAGNOSIS — R1310 Dysphagia, unspecified: Secondary | ICD-10-CM

## 2019-03-28 LAB — CBC WITH DIFFERENTIAL/PLATELET
Abs Immature Granulocytes: 0.02 10*3/uL (ref 0.00–0.07)
Basophils Absolute: 0 10*3/uL (ref 0.0–0.1)
Basophils Relative: 0 %
Eosinophils Absolute: 0 10*3/uL (ref 0.0–0.5)
Eosinophils Relative: 0 %
HCT: 33.9 % — ABNORMAL LOW (ref 36.0–46.0)
Hemoglobin: 10.2 g/dL — ABNORMAL LOW (ref 12.0–15.0)
Immature Granulocytes: 0 %
Lymphocytes Relative: 30 %
Lymphs Abs: 2.3 10*3/uL (ref 0.7–4.0)
MCH: 24 pg — ABNORMAL LOW (ref 26.0–34.0)
MCHC: 30.1 g/dL (ref 30.0–36.0)
MCV: 79.8 fL — ABNORMAL LOW (ref 80.0–100.0)
Monocytes Absolute: 0.6 10*3/uL (ref 0.1–1.0)
Monocytes Relative: 8 %
Neutro Abs: 4.8 10*3/uL (ref 1.7–7.7)
Neutrophils Relative %: 62 %
Platelets: 299 10*3/uL (ref 150–400)
RBC: 4.25 MIL/uL (ref 3.87–5.11)
RDW: 15.2 % (ref 11.5–15.5)
WBC: 7.7 10*3/uL (ref 4.0–10.5)
nRBC: 0 % (ref 0.0–0.2)

## 2019-03-28 LAB — COMPREHENSIVE METABOLIC PANEL
ALT: 13 U/L (ref 0–44)
AST: 15 U/L (ref 15–41)
Albumin: 3.4 g/dL — ABNORMAL LOW (ref 3.5–5.0)
Alkaline Phosphatase: 57 U/L (ref 38–126)
Anion gap: 6 (ref 5–15)
BUN: 6 mg/dL (ref 6–20)
CO2: 24 mmol/L (ref 22–32)
Calcium: 8.8 mg/dL — ABNORMAL LOW (ref 8.9–10.3)
Chloride: 108 mmol/L (ref 98–111)
Creatinine, Ser: 0.64 mg/dL (ref 0.44–1.00)
GFR calc Af Amer: >60 mL/min (ref >60)
GFR calc non Af Amer: >60 mL/min (ref >60)
Glucose, Bld: 93 mg/dL (ref 70–99)
Potassium: 3.7 mmol/L (ref 3.5–5.1)
Sodium: 138 mmol/L (ref 135–145)
Total Bilirubin: 0.4 mg/dL (ref 0.3–1.2)
Total Protein: 6.2 g/dL — ABNORMAL LOW (ref 6.5–8.1)

## 2019-03-28 LAB — LIPASE, BLOOD: Lipase: 34 U/L (ref 11–51)

## 2019-03-28 LAB — TROPONIN I (HIGH SENSITIVITY): Troponin I (High Sensitivity): <2 ng/L (ref <18)

## 2019-03-28 LAB — PREGNANCY, URINE: Preg Test, Ur: NEGATIVE

## 2019-03-28 MED ORDER — ALUM & MAG HYDROXIDE-SIMETH 200-200-20 MG/5ML PO SUSP
30.0000 mL | Freq: Once | ORAL | Status: AC
  Administered 2019-03-28: 30 mL via ORAL
  Filled 2019-03-28: qty 30

## 2019-03-28 MED ORDER — LIDOCAINE VISCOUS HCL 2 % MT SOLN
15.0000 mL | Freq: Once | OROMUCOSAL | Status: AC
  Administered 2019-03-28: 15 mL via ORAL
  Filled 2019-03-28: qty 15

## 2019-03-28 NOTE — ED Triage Notes (Addendum)
Pt c/o CP x 6 days-cough x 1 month and sore throat x 5 days-pt states throat pain and CP worse with cough-NAD-steady gait

## 2019-03-28 NOTE — ED Notes (Signed)
Pt on monitor 

## 2019-03-28 NOTE — ED Provider Notes (Signed)
[facility] EMERGENCY DEPARTMENT Provider Note   CSN: [id number] Arrival date & time: 03/28/19  2019     History   Chief Complaint Chief Complaint  Patient presents with  . Chest Pain    HPI Michelle Meza is a 43 y.o. female with no significant past medical history who presents today for evaluation of chest pain/throat pain.  She reports that over the past 6 days she has had worsening chest pain and cough.  Prior to this she had ear pain which has resolved.  She reports occasional postnasal drainage.  She denies any nausea, vomiting, or diarrhea. She denies any prior PE/DVT.  No family history of cardiac events before the age of 67.  She denies any recent surgeries or immobilizations.  No hemoptysis.  She reports that her throat hurts when she swallows.  This also happens whenever she eats or drinks anything.  She denies any recent dietary changes.       HPI  Past Medical History:  Diagnosis Date  . Medical history non-contributory     Patient Active Problem List   Diagnosis Date Noted  . [redacted] weeks gestation of pregnancy   . [redacted] weeks gestation of pregnancy   . Maternal age 23+, multigravida, antepartum   . Polyhydramnios   . Multigravida of advanced maternal age in second trimester 08/28/2014  . [redacted] weeks gestation of pregnancy 08/28/2014  . [redacted] weeks gestation of pregnancy   . Abnormal findings on antenatal screening   . AMA (advanced maternal age) multigravida 35+   . Encounter for fetal anatomic survey     Past Surgical History:  Procedure Laterality Date  . NO PAST SURGERIES       OB History    Gravida  3   Para  2   Term  2   Preterm      AB      Living  2     SAB      TAB      Ectopic      Multiple      Live Births               Home Medications    Prior to Admission medications   Medication Sig Start Date End Date Taking? Authorizing Provider  cephALEXin (KEFLEX) 500 MG capsule 2 caps po bid x 7 days 10/17/16   Arby Barrette, MD  ibuprofen (ADVIL,MOTRIN) 600 MG tablet Take 1 tablet (600 mg total) by mouth every 6 (six) hours as needed. 10/17/16   Arby Barrette, MD  metroNIDAZOLE (FLAGYL) 500 MG tablet Take 1 tablet (500 mg total) by mouth 2 (two) times daily. One tab PO bid x 14 days Patient not taking: Reported on 08/28/2014 05/27/14   Pisciotta, Joni Reining, PA-C  Prenatal Vit-Fe Fumarate-FA (PRENATAL MULTIVITAMIN) TABS tablet Take 1 tablet by mouth daily at 12 noon.    [provider]  sulfamethoxazole-trimethoprim (BACTRIM DS,SEPTRA DS) 800-160 MG tablet Take 1 tablet by mouth 2 (two) times daily.    [provider]  traMADol (ULTRAM) 50 MG tablet Take 2 tablets (100 mg total) by mouth every 6 (six) hours as needed. 10/17/16   Arby Barrette, MD    Family History No family history on file.  Social History Social History   Tobacco Use  . Smoking status: Current Every Day Smoker    Types: Cigarettes  . Smokeless tobacco: Never Used  Substance Use Topics  . Alcohol use: No  . Drug use: Yes  Types: Marijuana     Allergies   Patient has no known allergies.   Review of Systems Review of Systems  Constitutional: Negative for chills and fever.  HENT: Positive for postnasal drip and sore throat. Negative for ear pain and trouble swallowing.   Eyes: Negative for visual disturbance.  Respiratory: Negative for cough and shortness of breath.   Cardiovascular: Positive for chest pain. Negative for palpitations and leg swelling.  Gastrointestinal: Negative for abdominal pain, nausea and vomiting.  Genitourinary: Negative for dysuria.  Musculoskeletal: Negative for back pain and neck pain.  Skin: Negative for color change, rash and wound.  Neurological: Negative for weakness and headaches.  All other systems reviewed and are negative.    Physical Exam Updated Vital Signs BP 124/79   Pulse 62   Temp 98.5 F (36.9 C)   Resp 20   Ht 5' (1.524 m)   Wt 71.2 kg   LMP 03/18/2019    SpO2 100%   BMI 30.66 kg/m   Physical Exam Vitals signs and nursing note reviewed.  Constitutional:      General: She is not in acute distress.    Appearance: She is well-developed.  HENT:     Head: Normocephalic and atraumatic.     Right Ear: External ear normal.     Left Ear: External ear normal.     Nose: Nose normal.     Mouth/Throat:     Mouth: Mucous membranes are moist.     Palate: No mass.     Pharynx: Uvula midline.     Tonsils: No tonsillar exudate.     Comments: Posterior oropharynx with mild cobblestoning. Eyes:     Conjunctiva/sclera: Conjunctivae normal.  Neck:     Musculoskeletal: Full passive range of motion without pain, normal range of motion and neck supple. No erythema, crepitus, injury or pain with movement.     Thyroid: No thyroid tenderness.     Vascular: No JVD.     Trachea: Trachea and phonation normal. No tracheal deviation.  Cardiovascular:     Rate and Rhythm: Normal rate and regular rhythm.     Heart sounds: Normal heart sounds. No murmur.  Pulmonary:     Effort: Pulmonary effort is normal. No accessory muscle usage or respiratory distress.     Breath sounds: Normal breath sounds. No decreased breath sounds.  Chest:     Chest wall: No mass or tenderness.  Abdominal:     Palpations: Abdomen is soft.     Tenderness: There is no abdominal tenderness.  Musculoskeletal:     Right lower leg: She exhibits no tenderness. No edema.     Left lower leg: She exhibits no tenderness. No edema.  Lymphadenopathy:     Cervical: No cervical adenopathy.  Skin:    General: Skin is warm and dry.  Neurological:     General: No focal deficit present.     Mental Status: She is alert.  Psychiatric:        Mood and Affect: Mood normal.        Behavior: Behavior normal.      ED Treatments / Results  Labs (all labs ordered are listed, but only abnormal results are displayed) Labs Reviewed  CBC WITH DIFFERENTIAL/PLATELET - Abnormal; Notable for the following  components:      Result Value   Hemoglobin 10.2 (*)    HCT 33.9 (*)    MCV 79.8 (*)    MCH 24.0 (*)    All other components  within normal limits  COMPREHENSIVE METABOLIC PANEL - Abnormal; Notable for the following components:   Calcium 8.8 (*)    Total Protein 6.2 (*)    Albumin 3.4 (*)    All other components within normal limits  NOVEL CORONAVIRUS, NAA (HOSP ORDER, SEND-OUT TO REF LAB; TAT 18-24 HRS)  LIPASE, BLOOD  PREGNANCY, URINE  TROPONIN I (HIGH SENSITIVITY)    EKG None  Radiology Dg Chest Port 1 View  Result Date: 03/28/2019 CLINICAL DATA:  Cough EXAM: PORTABLE CHEST 1 VIEW COMPARISON:  None. FINDINGS: The heart size and mediastinal contours are within normal limits. Both lungs are clear. The visualized skeletal structures are unremarkable. IMPRESSION: No active disease. Electronically Signed   By: Jonna ClarkBindu  Avutu M.D.   On: 03/28/2019 22:14    Procedures Procedures (including critical care time)  Medications Ordered in ED Medications  alum & mag hydroxide-simeth (MAALOX/MYLANTA) 200-200-20 MG/5ML suspension 30 mL (30 mLs Oral Given 03/28/19 2301)    And  lidocaine (XYLOCAINE) 2 % viscous mouth solution 15 mL (15 mLs Oral Given 03/28/19 2301)     Initial Impression / Assessment and Plan / ED Course  I have reviewed the triage vital signs and the nursing notes.  Pertinent labs & imaging results that were available during my care of the patient were reviewed by me and considered in my medical decision making (see chart for details).       Patient presents today for evaluation of cough, burning feeling in her neck and upper chest.  Labs are obtained and reviewed, she does not have a significant leukocytosis.  She is mildly anemic with a hemoglobin of 10.2.  CMP does not show significant derangements.  Lipase is not elevated.  Pregnancy test negative.  Chest x-ray is obtained without evidence of consolidation, pneumothorax, pneumonia or other cause for her symptoms.   At this time troponin and official EKG read are still pending.  She was given a Maalox/viscous lidocaine cocktail while in the emergency room.  PERC negative.   At shift change care was transferred to Dr. Read DriversMolpus who will follow pending studies, re-evaulate and determine disposition.    Park MeoLakeisha Dildine was evaluated in Emergency Department on 03/28/2019 for the symptoms described in the history of present illness. She was evaluated in the context of the global COVID-19 pandemic, which necessitated consideration that the patient might be at risk for infection with the SARS-CoV-2 virus that causes COVID-19. Institutional protocols and algorithms that pertain to the evaluation of patients at risk for COVID-19 are in a state of rapid change based on information released by regulatory bodies including the CDC and federal and state organizations. These policies and algorithms were followed during the patient's care in the ED.   Final Clinical Impressions(s) / ED Diagnoses   Final diagnoses:  Atypical chest pain  Painful swallowing    ED Discharge Orders    None       Norman ClayHammond, Terrill Wauters W, PA-C 03/28/19 2342    Sabas SousBero, Michael M, MD 04/01/19 [phone]

## 2019-03-29 MED ORDER — PANTOPRAZOLE SODIUM 40 MG PO TBEC
40.0000 mg | DELAYED_RELEASE_TABLET | Freq: Once | ORAL | Status: AC
  Administered 2019-03-29: 01:00:00 40 mg via ORAL
  Filled 2019-03-29: qty 1

## 2019-03-29 MED ORDER — PANTOPRAZOLE SODIUM 40 MG IV SOLR: 40.0000 mg | Freq: Once | INTRAVENOUS | Status: DC

## 2019-03-29 MED ORDER — OMEPRAZOLE 40 MG PO CPDR: 40.0000 mg | DELAYED_RELEASE_CAPSULE | Freq: Every day | ORAL | 0 refills | Status: AC

## 2019-03-29 NOTE — ED Provider Notes (Signed)
Nursing notes and vitals signs, including pulse oximetry, reviewed.  Summary of this visit's results, reviewed by myself:  EKG:  EKG Interpretation  Date/Time:    Ventricular Rate:    PR Interval:    QRS Duration:   QT Interval:    QTC Calculation:   R Axis:     Text Interpretation:         Labs:  Results for orders placed or performed during the hospital encounter of 03/28/19 (from the past 24 hour(s))  CBC with Differential     Status: Abnormal   Collection Time: 03/28/19 11:02 PM  Result Value Ref Range   WBC 7.7 4.0 - 10.5 K/uL   RBC 4.25 3.87 - 5.11 MIL/uL   Hemoglobin 10.2 (L) 12.0 - 15.0 g/dL   HCT 33.9 (L) 36.0 - 46.0 %   MCV 79.8 (L) 80.0 - 100.0 fL   MCH 24.0 (L) 26.0 - 34.0 pg   MCHC 30.1 30.0 - 36.0 g/dL   RDW 15.2 11.5 - 15.5 %   Platelets 299 150 - 400 K/uL   nRBC 0.0 0.0 - 0.2 %   Neutrophils Relative % 62 %   Neutro Abs 4.8 1.7 - 7.7 K/uL   Lymphocytes Relative 30 %   Lymphs Abs 2.3 0.7 - 4.0 K/uL   Monocytes Relative 8 %   Monocytes Absolute 0.6 0.1 - 1.0 K/uL   Eosinophils Relative 0 %   Eosinophils Absolute 0.0 0.0 - 0.5 K/uL   Basophils Relative 0 %   Basophils Absolute 0.0 0.0 - 0.1 K/uL   Immature Granulocytes 0 %   Abs Immature Granulocytes 0.02 0.00 - 0.07 K/uL  Comprehensive metabolic panel     Status: Abnormal   Collection Time: 03/28/19 11:02 PM  Result Value Ref Range   Sodium 138 135 - 145 mmol/L   Potassium 3.7 3.5 - 5.1 mmol/L   Chloride 108 98 - 111 mmol/L   CO2 24 22 - 32 mmol/L   Glucose, Bld 93 70 - 99 mg/dL   BUN 6 6 - 20 mg/dL   Creatinine, Ser 0.64 0.44 - 1.00 mg/dL   Calcium 8.8 (L) 8.9 - 10.3 mg/dL   Total Protein 6.2 (L) 6.5 - 8.1 g/dL   Albumin 3.4 (L) 3.5 - 5.0 g/dL   AST 15 15 - 41 U/L   ALT 13 0 - 44 U/L   Alkaline Phosphatase 57 38 - 126 U/L   Total Bilirubin 0.4 0.3 - 1.2 mg/dL   GFR calc non Af Amer >60 >60 mL/min   GFR calc Af Amer >60 >60 mL/min   Anion gap 6 5 - 15  Lipase, blood     Status: None   Collection Time: 03/28/19 11:02 PM  Result Value Ref Range   Lipase 34 11 - 51 U/L  Troponin I (High Sensitivity)     Status: None   Collection Time: 03/28/19 11:02 PM  Result Value Ref Range   Troponin I (High Sensitivity) <2 <18 ng/L  Pregnancy, urine     Status: None   Collection Time: 03/28/19 11:02 PM  Result Value Ref Range   Preg Test, Ur NEGATIVE NEGATIVE    Imaging Studies: Dg Chest Port 1 View  Result Date: 03/28/2019 CLINICAL DATA:  Cough EXAM: PORTABLE CHEST 1 VIEW COMPARISON:  None. FINDINGS: The heart size and mediastinal contours are within normal limits. Both lungs are clear. The visualized skeletal structures are unremarkable. IMPRESSION: No active disease. Electronically Signed   By: Kerby Moors  Avutu M.D.   On: 03/28/2019 22:14   12:29 AM Patient symptoms improved after oral Maalox and viscous lidocaine.  Possible cause of her symptoms is acid reflux and we will start her on a PPI.   Eunie Lawn, Jonny Ruiz, MD 03/29/19 0030

## 2019-03-29 NOTE — ED Notes (Signed)
2nd Troponin not needed per Dr. Florina Ou

## 2019-03-31 LAB — NOVEL CORONAVIRUS, NAA (HOSP ORDER, SEND-OUT TO REF LAB; TAT 18-24 HRS): SARS-CoV-2, NAA: NOT DETECTED

## 2019-09-14 ENCOUNTER — Ambulatory Visit: Payer: Medicaid Other | Admitting: Family Medicine

## 2019-10-09 ENCOUNTER — Other Ambulatory Visit: Payer: Self-pay

## 2019-10-09 ENCOUNTER — Encounter (HOSPITAL_BASED_OUTPATIENT_CLINIC_OR_DEPARTMENT_OTHER): Payer: Self-pay | Admitting: *Deleted

## 2019-10-09 ENCOUNTER — Emergency Department (HOSPITAL_BASED_OUTPATIENT_CLINIC_OR_DEPARTMENT_OTHER)
Admission: EM | Admit: 2019-10-09 | Discharge: 2019-10-10 | Discharge status: Against Medical Advice | Payer: Medicaid Other | Attending: Emergency Medicine | Admitting: Emergency Medicine

## 2019-10-09 DIAGNOSIS — R05 Cough: Secondary | ICD-10-CM | POA: Diagnosis not present

## 2019-10-09 DIAGNOSIS — Z79899 Other long term (current) drug therapy: Secondary | ICD-10-CM | POA: Diagnosis not present

## 2019-10-09 DIAGNOSIS — F17210 Nicotine dependence, cigarettes, uncomplicated: Secondary | ICD-10-CM | POA: Insufficient documentation

## 2019-10-09 DIAGNOSIS — J029 Acute pharyngitis, unspecified: Secondary | ICD-10-CM | POA: Diagnosis present

## 2019-10-09 DIAGNOSIS — Z5321 Procedure and treatment not carried out due to patient leaving prior to being seen by health care provider: Secondary | ICD-10-CM | POA: Insufficient documentation

## 2019-10-09 NOTE — ED Triage Notes (Signed)
Pt c/o sore throat x 3 days

## 2019-10-10 NOTE — ED Provider Notes (Signed)
[facility] EMERGENCY DEPARTMENT Provider Note   CSN: [id number] Arrival date & time: 10/09/19  2345     History Chief Complaint  Patient presents with  . Sore Throat    Michelle Meza is a 44 y.o. female.  Patient presents to the emergency department for evaluation of sore throat.  She has been experiencing symptoms for 3 days.  She has developed a slight nonproductive cough.  She has not had any fever.  She does not have any known sick contacts.        Past Medical History:  Diagnosis Date  . Medical history non-contributory     Patient Active Problem List   Diagnosis Date Noted  . [redacted] weeks gestation of pregnancy   . [redacted] weeks gestation of pregnancy   . Maternal age 86+, multigravida, antepartum   . Polyhydramnios   . Multigravida of advanced maternal age in second trimester 08/28/2014  . [redacted] weeks gestation of pregnancy 08/28/2014  . [redacted] weeks gestation of pregnancy   . Abnormal findings on antenatal screening   . AMA (advanced maternal age) multigravida 35+   . Encounter for fetal anatomic survey     Past Surgical History:  Procedure Laterality Date  . NO PAST SURGERIES       OB History    Gravida  3   Para  2   Term  2   Preterm      AB      Living  2     SAB      TAB      Ectopic      Multiple      Live Births              History reviewed. No pertinent family history.  Social History   Tobacco Use  . Smoking status: Current Every Day Smoker    Types: Cigarettes  . Smokeless tobacco: Never Used  Substance Use Topics  . Alcohol use: No  . Drug use: Yes    Types: Marijuana    Home Medications Prior to Admission medications   Medication Sig Start Date End Date Taking? Authorizing Provider  omeprazole (PRILOSEC) 40 MG capsule Take 1 capsule (40 mg total) by mouth daily. 03/29/19   Molpus, John, MD  Prenatal Vit-Fe Fumarate-FA (PRENATAL MULTIVITAMIN) TABS tablet Take 1 tablet by mouth daily at 12 noon.    [provider]    Allergies    Patient has no known allergies.  Review of Systems   Review of Systems  HENT: Positive for congestion and sore throat.   Respiratory: Positive for cough.   All other systems reviewed and are negative.   Physical Exam Updated Vital Signs BP 127/90 (BP Location: Left Arm)   Pulse 71   Temp 98.8 F (37.1 C) (Oral)   Resp 18   Ht 5' (1.524 m)   Wt 71.7 kg   LMP 10/09/2019   SpO2 100%   BMI 30.86 kg/m   Physical Exam Vitals and nursing note reviewed.  Constitutional:      General: She is not in acute distress.    Appearance: Normal appearance. She is well-developed.  HENT:     Head: Normocephalic and atraumatic.     Right Ear: Hearing normal.     Left Ear: Hearing normal.     Nose: Nose normal.  Eyes:     Conjunctiva/sclera: Conjunctivae normal.     Pupils: Pupils are equal, round, and reactive to light.  Cardiovascular:  Rate and Rhythm: Regular rhythm.     Heart sounds: S1 normal and S2 normal. No murmur. No friction rub. No gallop.   Pulmonary:     Effort: Pulmonary effort is normal. No respiratory distress.     Breath sounds: Normal breath sounds.  Chest:     Chest wall: No tenderness.  Abdominal:     General: Bowel sounds are normal.     Palpations: Abdomen is soft.     Tenderness: There is no abdominal tenderness. There is no guarding or rebound. Negative signs include Murphy's sign and McBurney's sign.     Hernia: No hernia is present.  Musculoskeletal:        General: Normal range of motion.     Cervical back: Normal range of motion and neck supple.  Skin:    General: Skin is warm and dry.     Findings: No rash.  Neurological:     Mental Status: She is alert and oriented to person, place, and time.     GCS: GCS eye subscore is 4. GCS verbal subscore is 5. GCS motor subscore is 6.     Cranial Nerves: No cranial nerve deficit.     Sensory: No sensory deficit.     Coordination: Coordination normal.  Psychiatric:         Speech: Speech normal.        Behavior: Behavior normal.        Thought Content: Thought content normal.     ED Results / Procedures / Treatments   Labs (all labs ordered are listed, but only abnormal results are displayed) Labs Reviewed  GROUP A STREP BY PCR    EKG None  Radiology No results found.  Procedures Procedures (including critical care time)  Medications Ordered in ED Medications - No data to display  ED Course  I have reviewed the triage vital signs and the nursing notes.  Pertinent labs & imaging results that were available during my care of the patient were reviewed by me and considered in my medical decision making (see chart for details).    MDM Rules/Calculators/A&P                      Patient presented to emergency department with URI symptoms.  Patient eloped before strep testing can be collected.  Final Clinical Impression(s) / ED Diagnoses Final diagnoses:  Sore throat    Rx / DC Orders ED Discharge Orders    None       Shawneen Deetz, Gwenyth Allegra, MD 10/11/19 [phone]
